# Patient Record
Sex: Male | Born: 1995 | Race: White | Hispanic: No | Marital: Single | Smoking: Current some day smoker
Health system: Southern US, Community
[De-identification: ages and names within clinical notes are randomized; demographics above are authoritative.]

## PROBLEM LIST (undated history)

## (undated) DIAGNOSIS — W3400XA Accidental discharge from unspecified firearms or gun, initial encounter: Secondary | ICD-10-CM

## (undated) DIAGNOSIS — Y249XXA Unspecified firearm discharge, undetermined intent, initial encounter: Secondary | ICD-10-CM

## (undated) HISTORY — DX: Unspecified firearm discharge, undetermined intent, initial encounter: Y24.9XXA

## (undated) HISTORY — DX: Accidental discharge from unspecified firearms or gun, initial encounter: W34.00XA

---
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
## 2015-08-06 ENCOUNTER — Emergency Department (HOSPITAL_COMMUNITY)
Admission: EM | Admit: 2015-08-06 | Discharge: 2015-08-06 | Discharge status: Against Medical Advice | Payer: Self-pay | Attending: Emergency Medicine | Admitting: Emergency Medicine

## 2015-08-06 ENCOUNTER — Encounter (HOSPITAL_COMMUNITY): Payer: Self-pay | Admitting: *Deleted

## 2015-08-06 DIAGNOSIS — Z720 Tobacco use: Secondary | ICD-10-CM | POA: Insufficient documentation

## 2015-08-06 DIAGNOSIS — K088 Other specified disorders of teeth and supporting structures: Secondary | ICD-10-CM | POA: Insufficient documentation

## 2015-08-06 NOTE — ED Notes (Signed)
Called patient x2 sub waiting and main waiting no answer

## 2015-08-06 NOTE — ED Notes (Signed)
Pt has left side dental pain with mild facial swelling. Airway intact.

## 2017-02-24 ENCOUNTER — Emergency Department (HOSPITAL_COMMUNITY)
Admission: EM | Admit: 2017-02-24 | Discharge: 2017-02-24 | Disposition: A | Discharge status: Home / Self Care | Payer: Self-pay | Attending: Emergency Medicine | Admitting: Emergency Medicine

## 2017-02-24 ENCOUNTER — Encounter (HOSPITAL_COMMUNITY): Payer: Self-pay

## 2017-02-24 DIAGNOSIS — K029 Dental caries, unspecified: Secondary | ICD-10-CM

## 2017-02-24 DIAGNOSIS — F17210 Nicotine dependence, cigarettes, uncomplicated: Secondary | ICD-10-CM | POA: Insufficient documentation

## 2017-02-24 DIAGNOSIS — K047 Periapical abscess without sinus: Secondary | ICD-10-CM | POA: Insufficient documentation

## 2017-02-24 MED ORDER — PENICILLIN V POTASSIUM 500 MG PO TABS: 500.0000 mg | ORAL_TABLET | Freq: Three times a day (TID) | ORAL | 0 refills | Status: AC

## 2017-02-24 MED ORDER — HYDROCODONE-ACETAMINOPHEN 5-325 MG PO TABS
1.0000 | ORAL_TABLET | Freq: Once | ORAL | Status: AC
  Administered 2017-02-24: 1 via ORAL
  Filled 2017-02-24: qty 1

## 2017-02-24 MED ORDER — NAPROXEN 500 MG PO TABS: 500.0000 mg | ORAL_TABLET | Freq: Two times a day (BID) | ORAL | 0 refills | Status: AC | PRN

## 2017-02-24 NOTE — ED Provider Notes (Signed)
[facility]-EMERGENCY DEPT Provider Note   CSN: [id number] Arrival date & time: 02/24/17  1733  By signing my name below, I, Modena Jansky, attest that this documentation has been prepared under the direction and in the presence of non-physician practitioner, [place] Russo, PA-C. Electronically Signed: Modena Jansky, Scribe. 02/24/2017. 7:25 PM.  History   Chief Complaint Chief Complaint  Patient presents with  . Dental Pain   The history is provided by the patient. No language interpreter was used.    HPI Comments: Robert Potter is a 21 y.o. male who presents to the Emergency Department complaining of constant, moderate, throbbing, right lower dental pain that started last night. He has been taking ibuprofen and Tylenol without any relief. He has a tooth that has been causing him pain intermittently over the past 2 weeks. He has no dentist. He reports associated and anterior neck pain.  Denies fever, difficulty swallowing, difficulty breathing, or other complaints at this time.  History reviewed. No pertinent past medical history.  There are no active problems to display for this patient.   History reviewed. No pertinent surgical history.     Home Medications    Prior to Admission medications   Medication Sig Start Date End Date Taking? Authorizing Provider  naproxen (NAPROSYN) 500 MG tablet Take 1 tablet (500 mg total) by mouth 2 (two) times daily as needed for moderate pain. 02/24/17   [place] N Russo, PA-C  penicillin v potassium (VEETID) 500 MG tablet Take 1 tablet (500 mg total) by mouth 3 (three) times daily. 02/24/17 03/03/17  [place] N Russo, PA-C    Family History History reviewed. No pertinent family history.  Social History Social History  Substance Use Topics  . Smoking status: Current Every Day Smoker    Packs/day: 1.00    Types: Cigarettes  . Smokeless tobacco: Never Used  . Alcohol use No     Allergies   Patient has no known allergies.   Review of  Systems Review of Systems  Constitutional: Negative for fever.  HENT: Positive for dental problem (Right lower) and sore throat. Negative for trouble swallowing.   Musculoskeletal: Positive for neck pain (Anterior).     Physical Exam Updated Vital Signs BP (!) 144/99 (BP Location: Left Arm)   Pulse 64   Temp 98.4 F (36.9 C) (Oral)   Resp 16   Ht 5' 8.5" (1.74 m)   Wt 150 lb (68 kg)   SpO2 98%   BMI 22.48 kg/m   Physical Exam  Constitutional: He appears well-developed and well-nourished.  HENT:  Head: Normocephalic and atraumatic.  Mouth/Throat: Uvula is midline, oropharynx is clear and moist and mucous membranes are normal. No trismus in the jaw. Dental caries present. No dental abscesses or uvula swelling.  Right lower 2nd to last molar w obvious dental caries. Gingiva surrounding tooth mildly erythematous and edematous without fluctuant abscess. No drainaged noted. Poor dentition.  Eyes: Conjunctivae are normal.  Neck: Normal range of motion. Neck supple.  Pulmonary/Chest: Effort normal.  Lymphadenopathy:    He has no cervical adenopathy.  Psychiatric: He has a normal mood and affect. His behavior is normal.  Nursing note and vitals reviewed.    ED Treatments / Results  DIAGNOSTIC STUDIES: Oxygen Saturation is 98% on RA, normal by my interpretation.    COORDINATION OF CARE: 7:29 PM- Pt advised of plan for treatment and pt agrees.  Labs (all labs ordered are listed, but only abnormal results are displayed) Labs Reviewed - No data to display  EKG  EKG Interpretation None       Radiology No results found.  Procedures Procedures (including critical care time)  Medications Ordered in ED Medications  HYDROcodone-acetaminophen (NORCO/VICODIN) 5-325 MG per tablet 1 tablet (1 tablet Oral Given 02/24/17 2003)     Initial Impression / Assessment and Plan / ED Course  I have reviewed the triage vital signs and the nursing notes.  Pertinent labs & imaging  results that were available during my care of the patient were reviewed by me and considered in my medical decision making (see chart for details).      Patient with dental pain.  No gross abscess.  Exam unconcerning for Ludwig's angina or spread of infection.  Will treat with penicillin and pain medicine.  Urged patient to follow-up with dentist.  Resource packet given in d/c paperwork. Strict return precautions given. Pt afebrile, safe for discharge.  Patient discussed with Dr. Lynelle Doctor. Discussed results, findings, treatment and follow up. Patient advised of return precautions. Patient verbalized understanding and agreed with plan.   Final Clinical Impressions(s) / ED Diagnoses   Final diagnoses:  Pain due to dental caries  Dental infection    New Prescriptions Discharge Medication List as of 02/24/2017  7:55 PM    START taking these medications   Details  naproxen (NAPROSYN) 500 MG tablet Take 1 tablet (500 mg total) by mouth 2 (two) times daily as needed for moderate pain., Starting Thu 02/24/2017, Print    penicillin v potassium (VEETID) 500 MG tablet Take 1 tablet (500 mg total) by mouth 3 (three) times daily., Starting Thu 02/24/2017, Until Thu 03/03/2017, Print      I personally performed the services described in this documentation, which was scribed in my presence. The recorded information has been reviewed and is accurate.    [place] N Russo, PA-C 02/25/17 1610    Linwood Dibbles, MD 02/28/17 1330

## 2017-02-24 NOTE — ED Triage Notes (Signed)
Pt complains of right lower tooth pain x 2 weeks, pt appears to have broken tooth. Afebrile. Pt does not have a dentist.

## 2017-02-24 NOTE — Discharge Instructions (Signed)
Please read instructions below. You can take the prescribed Naproxen for pain. Take the antibiotic (penicillin) as prescribed until they are finished. Schedule an appointment with a dentist for management of your dental infection. Return to the ER for inability to swallow liquids, difficulty breathing, new or worsening symptoms.

## 2020-09-26 ENCOUNTER — Emergency Department (HOSPITAL_COMMUNITY): Payer: Self-pay

## 2020-09-26 ENCOUNTER — Encounter (HOSPITAL_COMMUNITY)
Admission: EM | Disposition: A | Discharge status: Home Health | Payer: Self-pay | Source: Home / Self Care | Attending: Vascular Surgery

## 2020-09-26 ENCOUNTER — Emergency Department (HOSPITAL_COMMUNITY): Payer: Self-pay | Admitting: Anesthesiology

## 2020-09-26 ENCOUNTER — Inpatient Hospital Stay (HOSPITAL_COMMUNITY)
Admission: EM | Admit: 2020-09-26 | Discharge: 2020-09-28 | DRG: 909 | Disposition: A | Discharge status: Home Health | Payer: Self-pay | Attending: Vascular Surgery | Admitting: Vascular Surgery

## 2020-09-26 ENCOUNTER — Encounter (HOSPITAL_COMMUNITY): Payer: Self-pay | Admitting: *Deleted

## 2020-09-26 ENCOUNTER — Other Ambulatory Visit: Payer: Self-pay

## 2020-09-26 DIAGNOSIS — F10920 Alcohol use, unspecified with intoxication, uncomplicated: Principal | ICD-10-CM

## 2020-09-26 DIAGNOSIS — W3400XA Accidental discharge from unspecified firearms or gun, initial encounter: Secondary | ICD-10-CM

## 2020-09-26 DIAGNOSIS — F17210 Nicotine dependence, cigarettes, uncomplicated: Secondary | ICD-10-CM | POA: Diagnosis present

## 2020-09-26 DIAGNOSIS — Z23 Encounter for immunization: Secondary | ICD-10-CM

## 2020-09-26 DIAGNOSIS — S75021A Major laceration of femoral artery, right leg, initial encounter: Principal | ICD-10-CM | POA: Diagnosis present

## 2020-09-26 DIAGNOSIS — Z20822 Contact with and (suspected) exposure to covid-19: Secondary | ICD-10-CM | POA: Diagnosis present

## 2020-09-26 DIAGNOSIS — F10120 Alcohol abuse with intoxication, uncomplicated: Secondary | ICD-10-CM | POA: Diagnosis present

## 2020-09-26 DIAGNOSIS — S71131A Puncture wound without foreign body, right thigh, initial encounter: Secondary | ICD-10-CM | POA: Diagnosis present

## 2020-09-26 HISTORY — PX: FEMORAL ARTERY EXPLORATION: SHX5160

## 2020-09-26 HISTORY — PX: PATCH ANGIOPLASTY: SHX6230

## 2020-09-26 LAB — I-STAT CHEM 8, ED
BUN: 4 mg/dL — ABNORMAL LOW (ref 6–20)
Calcium, Ion: 1.02 mmol/L — ABNORMAL LOW (ref 1.15–1.40)
Chloride: 101 mmol/L (ref 98–111)
Creatinine, Ser: 1.3 mg/dL — ABNORMAL HIGH (ref 0.61–1.24)
Glucose, Bld: 121 mg/dL — ABNORMAL HIGH (ref 70–99)
HCT: 56 % — ABNORMAL HIGH (ref 39.0–52.0)
Hemoglobin: 19 g/dL — ABNORMAL HIGH (ref 13.0–17.0)
Potassium: 4.2 mmol/L (ref 3.5–5.1)
Sodium: 140 mmol/L (ref 135–145)
TCO2: 22 mmol/L (ref 22–32)

## 2020-09-26 LAB — COMPREHENSIVE METABOLIC PANEL
ALT: 39 U/L (ref 0–44)
AST: 27 U/L (ref 15–41)
Albumin: 4.2 g/dL (ref 3.5–5.0)
Alkaline Phosphatase: 62 U/L (ref 38–126)
Anion gap: 12 (ref 5–15)
BUN: 5 mg/dL — ABNORMAL LOW (ref 6–20)
CO2: 25 mmol/L (ref 22–32)
Calcium: 8.9 mg/dL (ref 8.9–10.3)
Chloride: 103 mmol/L (ref 98–111)
Creatinine, Ser: 0.98 mg/dL (ref 0.61–1.24)
GFR, Estimated: >60 mL/min (ref >60)
Glucose, Bld: 129 mg/dL — ABNORMAL HIGH (ref 70–99)
Potassium: 4.2 mmol/L (ref 3.5–5.1)
Sodium: 140 mmol/L (ref 135–145)
Total Bilirubin: 0.3 mg/dL (ref 0.3–1.2)
Total Protein: 6.7 g/dL (ref 6.5–8.1)

## 2020-09-26 LAB — RESPIRATORY PANEL BY RT PCR (FLU A&B, COVID)
Influenza A by PCR: NEGATIVE
Influenza B by PCR: NEGATIVE
SARS Coronavirus 2 by RT PCR: NEGATIVE

## 2020-09-26 LAB — ETHANOL: Alcohol, Ethyl (B): 209 mg/dL — ABNORMAL HIGH (ref <10)

## 2020-09-26 LAB — SAMPLE TO BLOOD BANK

## 2020-09-26 LAB — CBC
HCT: 55.2 % — ABNORMAL HIGH (ref 39.0–52.0)
Hemoglobin: 18.9 g/dL — ABNORMAL HIGH (ref 13.0–17.0)
MCH: 32 pg (ref 26.0–34.0)
MCHC: 34.2 g/dL (ref 30.0–36.0)
MCV: 93.6 fL (ref 80.0–100.0)
Platelets: 302 10*3/uL (ref 150–400)
RBC: 5.9 MIL/uL — ABNORMAL HIGH (ref 4.22–5.81)
RDW: 12.2 % (ref 11.5–15.5)
WBC: 11.2 10*3/uL — ABNORMAL HIGH (ref 4.0–10.5)
nRBC: 0 % (ref 0.0–0.2)

## 2020-09-26 LAB — PROTIME-INR
INR: 1 (ref 0.8–1.2)
INR: 1 (ref 0.8–1.2)
Prothrombin Time: 12.5 seconds (ref 11.4–15.2)
Prothrombin Time: 12.7 seconds (ref 11.4–15.2)

## 2020-09-26 SURGERY — EXPLORATION, ARTERY, FEMORAL
Anesthesia: General | Site: Leg Upper | Laterality: Right

## 2020-09-26 MED ORDER — HYDRALAZINE HCL 20 MG/ML IJ SOLN: 5.0000 mg | INTRAMUSCULAR | Status: DC | PRN

## 2020-09-26 MED ORDER — LACTATED RINGERS IV SOLN: INTRAVENOUS | Status: DC

## 2020-09-26 MED ORDER — POTASSIUM CHLORIDE CRYS ER 20 MEQ PO TBCR: 20.0000 meq | EXTENDED_RELEASE_TABLET | Freq: Every day | ORAL | Status: DC | PRN

## 2020-09-26 MED ORDER — DEXAMETHASONE SODIUM PHOSPHATE 10 MG/ML IJ SOLN
INTRAMUSCULAR | Status: AC
  Filled 2020-09-26: qty 1

## 2020-09-26 MED ORDER — ACETAMINOPHEN 10 MG/ML IV SOLN
INTRAVENOUS | Status: AC
  Filled 2020-09-26: qty 100

## 2020-09-26 MED ORDER — SODIUM CHLORIDE 0.9 % IV SOLN: INTRAVENOUS | Status: DC

## 2020-09-26 MED ORDER — PROPOFOL 10 MG/ML IV BOLUS
INTRAVENOUS | Status: AC
  Filled 2020-09-26: qty 20

## 2020-09-26 MED ORDER — SODIUM CHLORIDE 0.9 % IV SOLN
INTRAVENOUS | Status: DC | PRN
  Administered 2020-09-26: 500 mL

## 2020-09-26 MED ORDER — MAGNESIUM SULFATE 2 GM/50ML IV SOLN: 2.0000 g | Freq: Every day | INTRAVENOUS | Status: DC | PRN

## 2020-09-26 MED ORDER — SODIUM CHLORIDE 0.9 % IV SOLN: 500.0000 mL | Freq: Once | INTRAVENOUS | Status: DC | PRN

## 2020-09-26 MED ORDER — ACETAMINOPHEN 10 MG/ML IV SOLN
1000.0000 mg | Freq: Once | INTRAVENOUS | Status: DC | PRN
  Administered 2020-09-26: 1000 mg via INTRAVENOUS

## 2020-09-26 MED ORDER — TETANUS-DIPHTH-ACELL PERTUSSIS 5-2.5-18.5 LF-MCG/0.5 IM SUSY
0.5000 mL | PREFILLED_SYRINGE | Freq: Once | INTRAMUSCULAR | Status: AC
  Administered 2020-09-26: 0.5 mL via INTRAMUSCULAR
  Filled 2020-09-26: qty 0.5

## 2020-09-26 MED ORDER — FENTANYL CITRATE (PF) 250 MCG/5ML IJ SOLN
INTRAMUSCULAR | Status: AC
  Filled 2020-09-26: qty 5

## 2020-09-26 MED ORDER — ONDANSETRON HCL 4 MG/2ML IJ SOLN
INTRAMUSCULAR | Status: DC | PRN
  Administered 2020-09-26: 4 mg via INTRAVENOUS

## 2020-09-26 MED ORDER — HEPARIN SODIUM (PORCINE) 1000 UNIT/ML IJ SOLN
INTRAMUSCULAR | Status: AC
  Filled 2020-09-26: qty 1

## 2020-09-26 MED ORDER — PROPOFOL 10 MG/ML IV BOLUS
INTRAVENOUS | Status: DC | PRN
  Administered 2020-09-26: 160 mg via INTRAVENOUS

## 2020-09-26 MED ORDER — HYDROMORPHONE HCL 1 MG/ML IJ SOLN
INTRAMUSCULAR | Status: AC
  Administered 2020-09-26: 0.25 mg via INTRAVENOUS
  Filled 2020-09-26: qty 1

## 2020-09-26 MED ORDER — HYDROMORPHONE HCL 1 MG/ML IJ SOLN
0.2500 mg | INTRAMUSCULAR | Status: DC | PRN
  Administered 2020-09-26: 0.25 mg via INTRAVENOUS
  Administered 2020-09-26 (×2): 0.5 mg via INTRAVENOUS

## 2020-09-26 MED ORDER — MIDAZOLAM HCL 5 MG/5ML IJ SOLN
INTRAMUSCULAR | Status: DC | PRN
  Administered 2020-09-26: 2 mg via INTRAVENOUS

## 2020-09-26 MED ORDER — HEPARIN SODIUM (PORCINE) 1000 UNIT/ML IJ SOLN
INTRAMUSCULAR | Status: DC | PRN
  Administered 2020-09-26: 8500 [IU] via INTRAVENOUS

## 2020-09-26 MED ORDER — THROMBIN (RECOMBINANT) 20000 UNITS EX SOLR
CUTANEOUS | Status: AC
  Filled 2020-09-26: qty 20000

## 2020-09-26 MED ORDER — DEXMEDETOMIDINE (PRECEDEX) IN NS 20 MCG/5ML (4 MCG/ML) IV SYRINGE
PREFILLED_SYRINGE | INTRAVENOUS | Status: DC | PRN
  Administered 2020-09-26: 8 ug via INTRAVENOUS
  Administered 2020-09-26 (×2): 12 ug via INTRAVENOUS
  Administered 2020-09-26: 8 ug via INTRAVENOUS

## 2020-09-26 MED ORDER — ALUM & MAG HYDROXIDE-SIMETH 200-200-20 MG/5ML PO SUSP: 15.0000 mL | ORAL | Status: DC | PRN

## 2020-09-26 MED ORDER — SUGAMMADEX SODIUM 200 MG/2ML IV SOLN
INTRAVENOUS | Status: DC | PRN
  Administered 2020-09-26: 200 mg via INTRAVENOUS

## 2020-09-26 MED ORDER — MORPHINE SULFATE (PF) 2 MG/ML IV SOLN
2.0000 mg | INTRAVENOUS | Status: DC | PRN
  Administered 2020-09-26 – 2020-09-27 (×4): 2 mg via INTRAVENOUS
  Filled 2020-09-26 (×4): qty 1

## 2020-09-26 MED ORDER — SODIUM CHLORIDE 0.9 % IV BOLUS (SEPSIS)
1000.0000 mL | Freq: Once | INTRAVENOUS | Status: AC
  Administered 2020-09-26: 1000 mL via INTRAVENOUS

## 2020-09-26 MED ORDER — BISACODYL 10 MG RE SUPP: 10.0000 mg | Freq: Every day | RECTAL | Status: DC | PRN

## 2020-09-26 MED ORDER — HEPARIN (PORCINE) 25000 UT/250ML-% IV SOLN
600.0000 [IU]/h | INTRAVENOUS | Status: DC
  Administered 2020-09-26: 600 [IU]/h via INTRAVENOUS
  Filled 2020-09-26: qty 250

## 2020-09-26 MED ORDER — MORPHINE SULFATE (PF) 4 MG/ML IV SOLN
4.0000 mg | Freq: Once | INTRAVENOUS | Status: AC
  Administered 2020-09-26: 4 mg via INTRAVENOUS
  Filled 2020-09-26: qty 1

## 2020-09-26 MED ORDER — ACETAMINOPHEN 160 MG/5ML PO SOLN: 325.0000 mg | Freq: Once | ORAL | Status: DC | PRN

## 2020-09-26 MED ORDER — ORAL CARE MOUTH RINSE: 15.0000 mL | Freq: Once | OROMUCOSAL | Status: AC

## 2020-09-26 MED ORDER — PAPAVERINE HCL 30 MG/ML IJ SOLN
INTRAMUSCULAR | Status: DC | PRN
  Administered 2020-09-26: 60 mg via INTRAVENOUS

## 2020-09-26 MED ORDER — FENTANYL CITRATE (PF) 100 MCG/2ML IJ SOLN
INTRAMUSCULAR | Status: DC | PRN
  Administered 2020-09-26 (×3): 50 ug via INTRAVENOUS
  Administered 2020-09-26: 100 ug via INTRAVENOUS
  Administered 2020-09-26 (×2): 50 ug via INTRAVENOUS

## 2020-09-26 MED ORDER — CEFAZOLIN SODIUM-DEXTROSE 2-4 GM/100ML-% IV SOLN: 2.0000 g | INTRAVENOUS | Status: DC

## 2020-09-26 MED ORDER — IOHEXOL 350 MG/ML SOLN
100.0000 mL | Freq: Once | INTRAVENOUS | Status: AC | PRN
  Administered 2020-09-26: 100 mL via INTRAVENOUS

## 2020-09-26 MED ORDER — DEXMEDETOMIDINE (PRECEDEX) IN NS 20 MCG/5ML (4 MCG/ML) IV SYRINGE
PREFILLED_SYRINGE | INTRAVENOUS | Status: AC
  Filled 2020-09-26: qty 10

## 2020-09-26 MED ORDER — PAPAVERINE HCL 30 MG/ML IJ SOLN
INTRAMUSCULAR | Status: AC
  Filled 2020-09-26: qty 2

## 2020-09-26 MED ORDER — METOPROLOL TARTRATE 5 MG/5ML IV SOLN: 2.0000 mg | INTRAVENOUS | Status: DC | PRN

## 2020-09-26 MED ORDER — ASPIRIN EC 81 MG PO TBEC
81.0000 mg | DELAYED_RELEASE_TABLET | Freq: Every day | ORAL | Status: DC
  Administered 2020-09-27 – 2020-09-28 (×2): 81 mg via ORAL
  Filled 2020-09-26 (×2): qty 1

## 2020-09-26 MED ORDER — CHLORHEXIDINE GLUCONATE 0.12 % MT SOLN
15.0000 mL | Freq: Once | OROMUCOSAL | Status: AC
  Administered 2020-09-26: 15 mL via OROMUCOSAL

## 2020-09-26 MED ORDER — HYDROMORPHONE HCL 1 MG/ML IJ SOLN
INTRAMUSCULAR | Status: AC
  Administered 2020-09-26: 0.5 mg via INTRAVENOUS
  Filled 2020-09-26: qty 1

## 2020-09-26 MED ORDER — GUAIFENESIN-DM 100-10 MG/5ML PO SYRP: 15.0000 mL | ORAL_SOLUTION | ORAL | Status: DC | PRN

## 2020-09-26 MED ORDER — ACETAMINOPHEN 325 MG PO TABS: 325.0000 mg | ORAL_TABLET | ORAL | Status: DC | PRN

## 2020-09-26 MED ORDER — 0.9 % SODIUM CHLORIDE (POUR BTL) OPTIME
TOPICAL | Status: DC | PRN
  Administered 2020-09-26 (×2): 1000 mL

## 2020-09-26 MED ORDER — ALBUTEROL SULFATE HFA 108 (90 BASE) MCG/ACT IN AERS
INHALATION_SPRAY | RESPIRATORY_TRACT | Status: DC | PRN
  Administered 2020-09-26 (×2): 3 via RESPIRATORY_TRACT

## 2020-09-26 MED ORDER — CEFAZOLIN SODIUM-DEXTROSE 2-3 GM-%(50ML) IV SOLR
INTRAVENOUS | Status: DC | PRN
  Administered 2020-09-26: 2 g via INTRAVENOUS

## 2020-09-26 MED ORDER — LIDOCAINE 2% (20 MG/ML) 5 ML SYRINGE
INTRAMUSCULAR | Status: DC | PRN
  Administered 2020-09-26: 40 mg via INTRAVENOUS

## 2020-09-26 MED ORDER — ONDANSETRON HCL 4 MG/2ML IJ SOLN
INTRAMUSCULAR | Status: AC
  Filled 2020-09-26: qty 2

## 2020-09-26 MED ORDER — CEFAZOLIN SODIUM-DEXTROSE 2-4 GM/100ML-% IV SOLN
2.0000 g | Freq: Once | INTRAVENOUS | Status: AC
  Administered 2020-09-26: 2 g via INTRAVENOUS
  Filled 2020-09-26: qty 100

## 2020-09-26 MED ORDER — ACETAMINOPHEN 325 MG PO TABS: 325.0000 mg | ORAL_TABLET | Freq: Once | ORAL | Status: DC | PRN

## 2020-09-26 MED ORDER — AMISULPRIDE (ANTIEMETIC) 5 MG/2ML IV SOLN: 10.0000 mg | Freq: Once | INTRAVENOUS | Status: DC | PRN

## 2020-09-26 MED ORDER — FENTANYL CITRATE (PF) 100 MCG/2ML IJ SOLN
50.0000 ug | Freq: Once | INTRAMUSCULAR | Status: AC
  Administered 2020-09-26: 50 ug via INTRAVENOUS
  Filled 2020-09-26: qty 2

## 2020-09-26 MED ORDER — ONDANSETRON HCL 4 MG/2ML IJ SOLN: 4.0000 mg | Freq: Four times a day (QID) | INTRAMUSCULAR | Status: DC | PRN

## 2020-09-26 MED ORDER — SODIUM CHLORIDE 0.9 % IV SOLN
INTRAVENOUS | Status: AC
  Filled 2020-09-26: qty 1.2

## 2020-09-26 MED ORDER — ACETAMINOPHEN 650 MG RE SUPP: 325.0000 mg | RECTAL | Status: DC | PRN

## 2020-09-26 MED ORDER — PHENOL 1.4 % MT LIQD: 1.0000 | OROMUCOSAL | Status: DC | PRN

## 2020-09-26 MED ORDER — ROCURONIUM BROMIDE 10 MG/ML (PF) SYRINGE
PREFILLED_SYRINGE | INTRAVENOUS | Status: DC | PRN
  Administered 2020-09-26: 60 mg via INTRAVENOUS
  Administered 2020-09-26: 20 mg via INTRAVENOUS

## 2020-09-26 MED ORDER — DOCUSATE SODIUM 100 MG PO CAPS
100.0000 mg | ORAL_CAPSULE | Freq: Every day | ORAL | Status: DC
  Filled 2020-09-26 (×2): qty 1

## 2020-09-26 MED ORDER — POLYETHYLENE GLYCOL 3350 17 G PO PACK: 17.0000 g | PACK | Freq: Every day | ORAL | Status: DC | PRN

## 2020-09-26 MED ORDER — PANTOPRAZOLE SODIUM 40 MG PO TBEC
40.0000 mg | DELAYED_RELEASE_TABLET | Freq: Every day | ORAL | Status: DC
  Administered 2020-09-27 – 2020-09-28 (×2): 40 mg via ORAL
  Filled 2020-09-26 (×2): qty 1

## 2020-09-26 MED ORDER — MEPERIDINE HCL 25 MG/ML IJ SOLN: 6.2500 mg | INTRAMUSCULAR | Status: DC | PRN

## 2020-09-26 MED ORDER — CEFAZOLIN SODIUM-DEXTROSE 2-4 GM/100ML-% IV SOLN
2.0000 g | Freq: Three times a day (TID) | INTRAVENOUS | Status: AC
  Administered 2020-09-26 – 2020-09-27 (×2): 2 g via INTRAVENOUS
  Filled 2020-09-26 (×2): qty 100

## 2020-09-26 MED ORDER — SUCCINYLCHOLINE CHLORIDE 200 MG/10ML IV SOSY
PREFILLED_SYRINGE | INTRAVENOUS | Status: DC | PRN
  Administered 2020-09-26: 120 mg via INTRAVENOUS

## 2020-09-26 MED ORDER — OXYCODONE-ACETAMINOPHEN 5-325 MG PO TABS
1.0000 | ORAL_TABLET | ORAL | Status: DC | PRN
  Administered 2020-09-26 – 2020-09-28 (×9): 2 via ORAL
  Filled 2020-09-26 (×9): qty 2

## 2020-09-26 MED ORDER — DEXAMETHASONE SODIUM PHOSPHATE 10 MG/ML IJ SOLN
INTRAMUSCULAR | Status: DC | PRN
  Administered 2020-09-26: 10 mg via INTRAVENOUS

## 2020-09-26 MED ORDER — CHLORHEXIDINE GLUCONATE 0.12 % MT SOLN
OROMUCOSAL | Status: AC
  Filled 2020-09-26: qty 15

## 2020-09-26 MED ORDER — MIDAZOLAM HCL 2 MG/2ML IJ SOLN
INTRAMUSCULAR | Status: AC
  Filled 2020-09-26: qty 2

## 2020-09-26 MED ORDER — LABETALOL HCL 5 MG/ML IV SOLN
10.0000 mg | INTRAVENOUS | Status: DC | PRN
  Administered 2020-09-27 (×2): 10 mg via INTRAVENOUS
  Filled 2020-09-26 (×2): qty 4

## 2020-09-26 SURGICAL SUPPLY — 62 items
BANDAGE ESMARK 6X9 LF (GAUZE/BANDAGES/DRESSINGS) IMPLANT
BNDG ELASTIC 4X5.8 VLCR STR LF (GAUZE/BANDAGES/DRESSINGS) IMPLANT
BNDG ESMARK 6X9 LF (GAUZE/BANDAGES/DRESSINGS)
CANISTER SUCT 3000ML PPV (MISCELLANEOUS) ×3 IMPLANT
CANNULA VESSEL 3MM 2 BLNT TIP (CANNULA) IMPLANT
CLIP VESOCCLUDE MED 24/CT (CLIP) ×3 IMPLANT
CLIP VESOCCLUDE SM WIDE 24/CT (CLIP) ×3 IMPLANT
COVER PROBE W GEL 5X96 (DRAPES) ×3 IMPLANT
COVER WAND RF STERILE (DRAPES) ×3 IMPLANT
CUFF TOURN SGL QUICK 24 (TOURNIQUET CUFF)
CUFF TOURN SGL QUICK 34 (TOURNIQUET CUFF)
CUFF TOURN SGL QUICK 42 (TOURNIQUET CUFF) IMPLANT
CUFF TRNQT CYL 24X4X16.5-23 (TOURNIQUET CUFF) IMPLANT
CUFF TRNQT CYL 34X4.125X (TOURNIQUET CUFF) IMPLANT
DERMABOND ADVANCED (GAUZE/BANDAGES/DRESSINGS) ×2
DERMABOND ADVANCED .7 DNX12 (GAUZE/BANDAGES/DRESSINGS) ×1 IMPLANT
DRAIN CHANNEL 15F RND FF W/TCR (WOUND CARE) IMPLANT
DRAIN HEMOVAC 1/8 X 5 (WOUND CARE) ×3 IMPLANT
DRAIN PENROSE 3/4X12 (DRAIN) IMPLANT
DRAPE X-RAY CASS 24X20 (DRAPES) IMPLANT
DRSG COVADERM 4X8 (GAUZE/BANDAGES/DRESSINGS) ×3 IMPLANT
DRSG TEGADERM 4X4.75 (GAUZE/BANDAGES/DRESSINGS) ×6 IMPLANT
ELECT REM PT RETURN 9FT ADLT (ELECTROSURGICAL) ×3
ELECTRODE REM PT RTRN 9FT ADLT (ELECTROSURGICAL) ×1 IMPLANT
EVACUATOR SILICONE 100CC (DRAIN) ×3 IMPLANT
GAUZE SPONGE 2X2 8PLY STRL LF (GAUZE/BANDAGES/DRESSINGS) ×1 IMPLANT
GAUZE SPONGE 4X4 16PLY XRAY LF (GAUZE/BANDAGES/DRESSINGS) ×3 IMPLANT
GLOVE BIO SURGEON STRL SZ7.5 (GLOVE) ×3 IMPLANT
GLOVE BIOGEL PI IND STRL 6.5 (GLOVE) ×1 IMPLANT
GLOVE BIOGEL PI IND STRL 8 (GLOVE) ×1 IMPLANT
GLOVE BIOGEL PI INDICATOR 6.5 (GLOVE) ×2
GLOVE BIOGEL PI INDICATOR 8 (GLOVE) ×2
GOWN STRL REUS W/ TWL LRG LVL3 (GOWN DISPOSABLE) ×3 IMPLANT
GOWN STRL REUS W/TWL LRG LVL3 (GOWN DISPOSABLE) ×9
KIT BASIN OR (CUSTOM PROCEDURE TRAY) ×3 IMPLANT
KIT TURNOVER KIT B (KITS) ×3 IMPLANT
MARKER GRAFT CORONARY BYPASS (MISCELLANEOUS) IMPLANT
NS IRRIG 1000ML POUR BTL (IV SOLUTION) ×6 IMPLANT
PACK PERIPHERAL VASCULAR (CUSTOM PROCEDURE TRAY) ×3 IMPLANT
PAD ARMBOARD 7.5X6 YLW CONV (MISCELLANEOUS) ×6 IMPLANT
SET COLLECT BLD 21X3/4 12 (NEEDLE) IMPLANT
SPONGE GAUZE 2X2 STER 10/PKG (GAUZE/BANDAGES/DRESSINGS) ×2
SPONGE SURGIFOAM ABS GEL 100 (HEMOSTASIS) IMPLANT
STAPLER VISISTAT (STAPLE) IMPLANT
STOPCOCK 4 WAY LG BORE MALE ST (IV SETS) IMPLANT
SUT ETHILON 3 0 PS 1 (SUTURE) ×3 IMPLANT
SUT PROLENE 5 0 C 1 24 (SUTURE) ×3 IMPLANT
SUT PROLENE 6 0 BV (SUTURE) ×6 IMPLANT
SUT PROLENE 7 0 BV 1 (SUTURE) IMPLANT
SUT SILK 2 0 PERMA HAND 18 BK (SUTURE) ×3 IMPLANT
SUT SILK 3 0 (SUTURE)
SUT SILK 3-0 18XBRD TIE 12 (SUTURE) IMPLANT
SUT VIC AB 2-0 CTB1 (SUTURE) ×3 IMPLANT
SUT VIC AB 3-0 SH 27 (SUTURE) ×6
SUT VIC AB 3-0 SH 27X BRD (SUTURE) ×2 IMPLANT
SUT VICRYL 4-0 PS2 18IN ABS (SUTURE) ×6 IMPLANT
TAPE CLOTH SURG 4X10 WHT LF (GAUZE/BANDAGES/DRESSINGS) ×3 IMPLANT
TOWEL GREEN STERILE (TOWEL DISPOSABLE) ×3 IMPLANT
TRAY FOLEY MTR SLVR 16FR STAT (SET/KITS/TRAYS/PACK) ×3 IMPLANT
TUBING EXTENTION W/L.L. (IV SETS) IMPLANT
UNDERPAD 30X36 HEAVY ABSORB (UNDERPADS AND DIAPERS) ×3 IMPLANT
WATER STERILE IRR 1000ML POUR (IV SOLUTION) ×3 IMPLANT

## 2020-09-26 NOTE — Anesthesia Procedure Notes (Signed)
Procedure Name: Intubation Date/Time: 09/26/2020 12:55 PM Performed by: Adria Dill, CRNA Pre-anesthesia Checklist: Patient identified, Emergency Drugs available, Suction available and Patient being monitored Patient Re-evaluated:Patient Re-evaluated prior to induction Oxygen Delivery Method: Circle system utilized Preoxygenation: Pre-oxygenation with 100% oxygen Induction Type: IV induction, Rapid sequence and Cricoid Pressure applied Laryngoscope Size: Miller and 2 Grade View: Grade I Tube type: Oral Tube size: 7.5 mm Number of attempts: 1 Airway Equipment and Method: Stylet and Oral airway Placement Confirmation: ETT inserted through vocal cords under direct vision,  positive ETCO2 and breath sounds checked- equal and bilateral Secured at: 21 cm Tube secured with: Tape Dental Injury: Teeth and Oropharynx as per pre-operative assessment

## 2020-09-26 NOTE — Anesthesia Preprocedure Evaluation (Addendum)
Anesthesia Evaluation  Patient identified by MRN, date of birth, ID band Patient awake    Reviewed: Allergy & Precautions, NPO status , Patient's Chart, lab work & pertinent test results  Airway Mallampati: II  TM Distance: >3 FB Neck ROM: Full    Dental  (+) Teeth Intact, Dental Advisory Given   Pulmonary Current Smoker and Patient abstained from smoking.,    breath sounds clear to auscultation       Cardiovascular negative cardio ROS   Rhythm:Regular Rate:Normal     Neuro/Psych negative neurological ROS  negative psych ROS   GI/Hepatic negative GI ROS, Neg liver ROS,   Endo/Other  negative endocrine ROS  Renal/GU negative Renal ROS     Musculoskeletal negative musculoskeletal ROS (+)   Abdominal Normal abdominal exam  (+)   Peds  Hematology negative hematology ROS (+)   Anesthesia Other Findings   Reproductive/Obstetrics                             Anesthesia Physical Anesthesia Plan  ASA: II  Anesthesia Plan: General   Post-op Pain Management:    Induction: Intravenous, Rapid sequence and Cricoid pressure planned  PONV Risk Score and Plan: 2 and Ondansetron  Airway Management Planned: Oral ETT  Additional Equipment: None  Intra-op Plan:   Post-operative Plan: Extubation in OR  Informed Consent: I have reviewed the patients History and Physical, chart, labs and discussed the procedure including the risks, benefits and alternatives for the proposed anesthesia with the patient or authorized representative who has indicated his/her understanding and acceptance.     Dental advisory given  Plan Discussed with: CRNA  Anesthesia Plan Comments:         Anesthesia Quick Evaluation
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
## 2020-09-26 NOTE — Anesthesia Postprocedure Evaluation (Signed)
Anesthesia Post Note  Patient: Sandy Blouch  Procedure(s) Performed: Superficial Femoral Artery Exploration (Right Leg Upper) Superficial Femoral Artery Patch Angioplasty using patch from Right Leg Greater Saphenous Vein, and repair of Intimal Defect (Right Leg Upper)     Patient location during evaluation: PACU Anesthesia Type: General Level of consciousness: awake and alert Pain management: pain level controlled Vital Signs Assessment: post-procedure vital signs reviewed and stable Respiratory status: spontaneous breathing, nonlabored ventilation, respiratory function stable and patient connected to nasal cannula oxygen Cardiovascular status: blood pressure returned to baseline and stable Postop Assessment: no apparent nausea or vomiting Anesthetic complications: no   No complications documented.  Last Vitals:  Vitals:   09/26/20 1540 09/26/20 1555  BP: 130/76 137/87  Pulse: 66 80  Resp: 14 19  Temp:    SpO2: 93% 97%    Last Pain:  Vitals:   09/26/20 1540  TempSrc:   PainSc: Asleep                 Shelton Silvas
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
## 2020-09-26 NOTE — Transfer of Care (Signed)
Immediate Anesthesia Transfer of Care Note  Patient: Robert Potter  Procedure(s) Performed: Superficial Femoral Artery Exploration (Right Leg Upper) Superficial Femoral Artery Patch Angioplasty using patch from Right Leg Greater Saphenous Vein, and repair of Intimal Defect (Right Leg Upper)  Patient Location: PACU  Anesthesia Type:General  Level of Consciousness: awake, oriented and patient cooperative  Airway & Oxygen Therapy: Patient Spontanous Breathing and Patient connected to nasal cannula oxygen  Post-op Assessment: Report given to RN and Post -op Vital signs reviewed and stable  Post vital signs: Reviewed  Last Vitals:  Vitals Value Taken Time  BP 144/77 09/26/20 1409  Temp    Pulse 86 09/26/20 1411  Resp 18 09/26/20 1411  SpO2 93 % 09/26/20 1411  Vitals shown include unvalidated device data.  Last Pain:  Vitals:   09/26/20 1016  TempSrc:   PainSc: 10-Worst pain ever      Patients Stated Pain Goal: 2 (09/26/20 1016)  Complications: No complications documented.
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
## 2020-09-27 ENCOUNTER — Encounter (HOSPITAL_COMMUNITY): Payer: Self-pay | Admitting: Vascular Surgery

## 2020-09-27 LAB — APTT: aPTT: 27 seconds (ref 24–36)

## 2020-09-27 LAB — CBC
HCT: 49.9 % (ref 39.0–52.0)
Hemoglobin: 17 g/dL (ref 13.0–17.0)
MCH: 31.6 pg (ref 26.0–34.0)
MCHC: 34.1 g/dL (ref 30.0–36.0)
MCV: 92.8 fL (ref 80.0–100.0)
Platelets: 253 10*3/uL (ref 150–400)
RBC: 5.38 MIL/uL (ref 4.22–5.81)
RDW: 12.3 % (ref 11.5–15.5)
WBC: 9.6 10*3/uL (ref 4.0–10.5)
nRBC: 0 % (ref 0.0–0.2)

## 2020-09-27 LAB — BASIC METABOLIC PANEL
Anion gap: 10 (ref 5–15)
BUN: <5 mg/dL — ABNORMAL LOW (ref 6–20)
CO2: 26 mmol/L (ref 22–32)
Calcium: 8.8 mg/dL — ABNORMAL LOW (ref 8.9–10.3)
Chloride: 100 mmol/L (ref 98–111)
Creatinine, Ser: 0.82 mg/dL (ref 0.61–1.24)
GFR, Estimated: >60 mL/min (ref >60)
Glucose, Bld: 117 mg/dL — ABNORMAL HIGH (ref 70–99)
Potassium: 4 mmol/L (ref 3.5–5.1)
Sodium: 136 mmol/L (ref 135–145)
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
## 2020-09-28 MED ORDER — ASPIRIN 81 MG PO TBEC: 81.0000 mg | DELAYED_RELEASE_TABLET | Freq: Every day | ORAL | Status: AC

## 2020-09-28 MED ORDER — OXYCODONE-ACETAMINOPHEN 5-325 MG PO TABS
1.0000 | ORAL_TABLET | Freq: Four times a day (QID) | ORAL | 0 refills | Status: DC | PRN
Start: 2020-09-28 — End: 2020-10-29

## 2020-09-28 NOTE — Discharge Instructions (Signed)
 [facility] [place] [facility]  Discharge instructions  Lower Extremity Bypass Surgery  Please refer to the following instruction for your post-procedure care. Your surgeon or physician assistant will discuss any changes with you.  Activity  You are encouraged to walk as much as you can. You can slowly return to normal activities during the month after your surgery. Avoid strenuous activity and heavy lifting until your doctor tells you it's OK. Avoid activities such as vacuuming or swinging a golf club. Do not drive until your doctor give the OK and you are no longer taking prescription pain medications. It is also normal to have difficulty with sleep habits, eating and bowel movement after surgery. These will go away with time.  Bathing/Showering  Shower daily after you go home. Do not soak in a bathtub, hot tub, or swim until the incision heals completely.  Incision Care  Clean your incision with mild soap and water. Shower every day. Pat the area dry with a clean towel. You do not need a bandage unless otherwise instructed. Do not apply any ointments or creams to your incision. If you have open wounds you will be instructed how to care for them or a visiting nurse may be arranged for you. If you have staples or sutures along your incision they will be removed at your post-op appointment. You may have skin glue on your incision. Do not peel it off. It will come off on its own in about one week.   Diet  Resume your normal diet. There are no special food restrictions following this procedure. A low fat/ low cholesterol diet is recommended for all patients with vascular disease. In order to heal from your surgery, it is CRITICAL to get adequate nutrition. Your body requires vitamins, minerals, and protein. Vegetables are the best source of vitamins and minerals. Vegetables also provide the perfect balance of protein. Processed food has little nutritional value, so try to avoid  this.  Medications  Resume taking all your medications unless your doctor or physician assistant tells you not to. If your incision is causing pain, you may take over-the-counter pain relievers such as acetaminophen (Tylenol). If you were prescribed a stronger pain medication, please aware these medication can cause nausea and constipation. Prevent nausea by taking the medication with a snack or meal. Avoid constipation by drinking plenty of fluids and eating foods with high amount of fiber, such as fruits, vegetables, and grains. Take Colace 100 mg (an over-the-counter stool softener) twice a day as needed for constipation.  Do not take Tylenol if you are taking prescription pain medications.  Follow Up  Our office will schedule a follow up appointment 2-3 weeks following discharge.  Please call us immediately for any of the following conditions  .Severe or worsening pain in your legs or feet while at rest or while walking .Increase pain, redness, warmth, or drainage (pus) from your incision site(s) Fever of 101 degree or higher The swelling in your leg with the bypass suddenly worsens and becomes more painful than when you were in the hospital If you have been instructed to feel your graft pulse then you should do so every day. If you can no longer feel this pulse, call the office immediately. Not all patients are given this instruction.  Leg swelling is common after leg bypass surgery.  The swelling should improve over a few months following surgery. To improve the swelling, you may elevate your legs above the level of your heart while   you are sitting or resting. Your surgeon or physician assistant may ask you to apply an ACE wrap or wear compression (TED) stockings to help to reduce swelling.  Reduce your risk of vascular disease  Stop smoking. If you would like help call [facility] at 1-800-QUIT-NOW (1-800-784-8669) or [facility] at 336-586-4000.  Manage your cholesterol Maintain a  desired weight Control your diabetes weight Control your diabetes Keep your blood pressure down  If you have any questions, please call the office at [phone]   
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
## 2020-09-28 NOTE — Progress Notes (Signed)
   09/28/20 0900  OTHER  Substance Abuse Education Offered Yes  Substance abuse interventions Other (must comment);Patient Counseling (Patient reports he is curretly involved with life changes for substance use classes. Patient reports current drinking few times a week and on occasion drinking four or more drinks in the same night.)  (CAGE-AID) Substance Abuse Screening Tool  Have You Ever Felt You Ought to Cut Down on Your Drinking or Drug Use? 1  Have People Annoyed You By Critizing Your Drinking Or Drug Use? 1  Have You Felt Bad Or Guilty About Your Drinking Or Drug Use? 0  Have You Ever Had a Drink or Used Drugs First Thing In The Morning to Steady Your Nerves or to Get Rid of a Hangover? 0  CAGE-AID Score 2

## 2020-09-28 NOTE — Progress Notes (Signed)
JP drain removed per physician orders, vitals signs taken and documented. Patient tolerated well. Entry point covered with gauze and tape. Will continue to monitor.  -Elissa Lovett, RN

## 2020-09-29 ENCOUNTER — Encounter (HOSPITAL_COMMUNITY): Payer: Self-pay

## 2020-10-29 ENCOUNTER — Other Ambulatory Visit: Payer: Self-pay

## 2020-10-29 ENCOUNTER — Encounter: Payer: Self-pay | Admitting: Vascular Surgery

## 2020-10-29 ENCOUNTER — Ambulatory Visit (INDEPENDENT_AMBULATORY_CARE_PROVIDER_SITE_OTHER): Payer: Self-pay | Admitting: Vascular Surgery

## 2020-10-29 VITALS — BP 128/86 | HR 95 | Temp 98.2°F | Resp 20 | Ht 68.0 in | Wt 180.0 lb

## 2020-10-29 DIAGNOSIS — W3400XA Accidental discharge from unspecified firearms or gun, initial encounter: Secondary | ICD-10-CM

## 2020-10-29 DIAGNOSIS — S75091A Other specified injury of femoral artery, right leg, initial encounter: Secondary | ICD-10-CM
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
## 2020-10-30 ENCOUNTER — Other Ambulatory Visit: Payer: Self-pay

## 2020-10-30 DIAGNOSIS — W3400XA Accidental discharge from unspecified firearms or gun, initial encounter: Secondary | ICD-10-CM

## 2021-05-06 ENCOUNTER — Ambulatory Visit: Payer: Self-pay | Admitting: Vascular Surgery

## 2021-05-06 ENCOUNTER — Ambulatory Visit (HOSPITAL_COMMUNITY): Payer: Self-pay

## 2021-05-06 ENCOUNTER — Ambulatory Visit (HOSPITAL_COMMUNITY): Payer: Self-pay | Attending: Vascular Surgery

## 2021-05-12 ENCOUNTER — Other Ambulatory Visit: Payer: Self-pay

## 2021-05-12 ENCOUNTER — Emergency Department
Admission: EM | Admit: 2021-05-12 | Discharge: 2021-05-12 | Disposition: A | Discharge status: Home / Self Care | Payer: Self-pay | Attending: Emergency Medicine | Admitting: Emergency Medicine

## 2021-05-12 DIAGNOSIS — F17210 Nicotine dependence, cigarettes, uncomplicated: Secondary | ICD-10-CM | POA: Insufficient documentation

## 2021-05-12 DIAGNOSIS — Z7982 Long term (current) use of aspirin: Secondary | ICD-10-CM | POA: Insufficient documentation

## 2021-05-12 DIAGNOSIS — R21 Rash and other nonspecific skin eruption: Secondary | ICD-10-CM | POA: Insufficient documentation

## 2021-05-12 MED ORDER — PREDNISONE 10 MG (21) PO TBPK: ORAL_TABLET | ORAL | 0 refills | Status: AC

## 2021-05-12 MED ORDER — DOXYCYCLINE HYCLATE 100 MG PO CAPS: 100.0000 mg | ORAL_CAPSULE | Freq: Two times a day (BID) | ORAL | 0 refills | Status: AC

## 2021-05-12 MED ORDER — DIPHENHYDRAMINE HCL 25 MG PO TABS: 25.0000 mg | ORAL_TABLET | Freq: Four times a day (QID) | ORAL | 0 refills | Status: AC

## 2021-05-12 NOTE — ED Notes (Signed)
MD in room

## 2021-05-12 NOTE — ED Triage Notes (Signed)
Pt c/o rash all over for the past week with SOB. Denies any known allergies. Pt is in NAD at present.
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
## 2023-03-02 ENCOUNTER — Ambulatory Visit
Admission: EM | Admit: 2023-03-02 | Discharge: 2023-03-02 | Disposition: A | Discharge status: Home / Self Care | Payer: Self-pay | Attending: Emergency Medicine | Admitting: Emergency Medicine

## 2023-03-02 DIAGNOSIS — J069 Acute upper respiratory infection, unspecified: Secondary | ICD-10-CM

## 2023-03-02 DIAGNOSIS — H1033 Unspecified acute conjunctivitis, bilateral: Secondary | ICD-10-CM

## 2023-03-02 MED ORDER — POLYMYXIN B-TRIMETHOPRIM 10000-0.1 UNIT/ML-% OP SOLN: 1.0000 [drp] | Freq: Four times a day (QID) | OPHTHALMIC | 0 refills | Status: AC

## 2023-03-02 NOTE — Discharge Instructions (Addendum)
Use the antibiotic eyedrops as prescribed.  Follow up with your primary care provider if your symptoms are not improving.   ? ? ?

## 2023-03-02 NOTE — ED Triage Notes (Signed)
Patient to Urgent Care with complaints of bilateral eye drainage that started three days ago. Right sided eye swelling into upper and lower eyelids. Waking up with eyes crusted shut.  Has also had URI symptoms/ bilateral ear fullness.
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
## 2023-03-03 ENCOUNTER — Other Ambulatory Visit: Payer: Self-pay

## 2023-03-03 ENCOUNTER — Emergency Department: Payer: Self-pay

## 2023-03-03 ENCOUNTER — Encounter: Payer: Self-pay | Admitting: Intensive Care

## 2023-03-03 ENCOUNTER — Emergency Department
Admission: EM | Admit: 2023-03-03 | Discharge: 2023-03-03 | Disposition: A | Discharge status: Home / Self Care | Payer: Self-pay | Attending: Emergency Medicine | Admitting: Emergency Medicine

## 2023-03-03 DIAGNOSIS — Z1152 Encounter for screening for COVID-19: Secondary | ICD-10-CM | POA: Insufficient documentation

## 2023-03-03 DIAGNOSIS — J029 Acute pharyngitis, unspecified: Secondary | ICD-10-CM | POA: Insufficient documentation

## 2023-03-03 DIAGNOSIS — L03213 Periorbital cellulitis: Secondary | ICD-10-CM | POA: Insufficient documentation

## 2023-03-03 DIAGNOSIS — D72829 Elevated white blood cell count, unspecified: Secondary | ICD-10-CM | POA: Insufficient documentation

## 2023-03-03 LAB — RESP PANEL BY RT-PCR (RSV, FLU A&B, COVID)  RVPGX2
Influenza A by PCR: NEGATIVE
Influenza B by PCR: NEGATIVE
Resp Syncytial Virus by PCR: NEGATIVE
SARS Coronavirus 2 by RT PCR: NEGATIVE

## 2023-03-03 LAB — CBC WITH DIFFERENTIAL/PLATELET
Abs Immature Granulocytes: 0.02 10*3/uL (ref 0.00–0.07)
Basophils Absolute: 0.1 10*3/uL (ref 0.0–0.1)
Basophils Relative: 1 %
Eosinophils Absolute: 0.1 10*3/uL (ref 0.0–0.5)
Eosinophils Relative: 1 %
HCT: 50.4 % (ref 39.0–52.0)
Hemoglobin: 18 g/dL — ABNORMAL HIGH (ref 13.0–17.0)
Immature Granulocytes: 0 %
Lymphocytes Relative: 21 %
Lymphs Abs: 2.3 10*3/uL (ref 0.7–4.0)
MCH: 33 pg (ref 26.0–34.0)
MCHC: 35.7 g/dL (ref 30.0–36.0)
MCV: 92.5 fL (ref 80.0–100.0)
Monocytes Absolute: 0.7 10*3/uL (ref 0.1–1.0)
Monocytes Relative: 7 %
Neutro Abs: 7.8 10*3/uL — ABNORMAL HIGH (ref 1.7–7.7)
Neutrophils Relative %: 70 %
Platelets: 299 10*3/uL (ref 150–400)
RBC: 5.45 MIL/uL (ref 4.22–5.81)
RDW: 11.4 % — ABNORMAL LOW (ref 11.5–15.5)
WBC: 10.9 10*3/uL — ABNORMAL HIGH (ref 4.0–10.5)
nRBC: 0 % (ref 0.0–0.2)

## 2023-03-03 LAB — BASIC METABOLIC PANEL
Anion gap: 10 (ref 5–15)
BUN: 13 mg/dL (ref 6–20)
CO2: 23 mmol/L (ref 22–32)
Calcium: 9.2 mg/dL (ref 8.9–10.3)
Chloride: 104 mmol/L (ref 98–111)
Creatinine, Ser: 0.9 mg/dL (ref 0.61–1.24)
GFR, Estimated: >60 mL/min (ref >60)
Glucose, Bld: 94 mg/dL (ref 70–99)
Potassium: 4.1 mmol/L (ref 3.5–5.1)
Sodium: 137 mmol/L (ref 135–145)

## 2023-03-03 LAB — GROUP A STREP BY PCR: Group A Strep by PCR: NOT DETECTED

## 2023-03-03 MED ORDER — FLUORESCEIN SODIUM 1 MG OP STRP
1.0000 | ORAL_STRIP | Freq: Once | OPHTHALMIC | Status: AC
  Administered 2023-03-03: 1 via OPHTHALMIC
  Filled 2023-03-03: qty 1

## 2023-03-03 MED ORDER — KETOROLAC TROMETHAMINE 15 MG/ML IJ SOLN
15.0000 mg | Freq: Once | INTRAMUSCULAR | Status: AC
  Administered 2023-03-03: 15 mg via INTRAVENOUS
  Filled 2023-03-03: qty 1

## 2023-03-03 MED ORDER — AMOXICILLIN-POT CLAVULANATE 875-125 MG PO TABS: 1.0000 | ORAL_TABLET | Freq: Two times a day (BID) | ORAL | 0 refills | Status: AC

## 2023-03-03 MED ORDER — DEXAMETHASONE SODIUM PHOSPHATE 10 MG/ML IJ SOLN
10.0000 mg | Freq: Once | INTRAMUSCULAR | Status: AC
  Administered 2023-03-03: 10 mg via INTRAVENOUS
  Filled 2023-03-03: qty 1

## 2023-03-03 MED ORDER — PROPARACAINE HCL 0.5 % OP SOLN
1.0000 [drp] | Freq: Once | OPHTHALMIC | Status: AC
  Administered 2023-03-03: 1 [drp] via OPHTHALMIC
  Filled 2023-03-03: qty 15

## 2023-03-03 MED ORDER — SODIUM CHLORIDE 0.9 % IV BOLUS
1000.0000 mL | Freq: Once | INTRAVENOUS | Status: AC
  Administered 2023-03-03: 1000 mL via INTRAVENOUS

## 2023-03-03 MED ORDER — IOHEXOL 300 MG/ML  SOLN
75.0000 mL | Freq: Once | INTRAMUSCULAR | Status: AC | PRN
  Administered 2023-03-03: 75 mL via INTRAVENOUS

## 2023-03-03 MED ORDER — ERYTHROMYCIN 5 MG/GM OP OINT: 1.0000 | TOPICAL_OINTMENT | Freq: Every day | OPHTHALMIC | 0 refills | Status: AC
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
## 2023-03-03 NOTE — Discharge Instructions (Addendum)
Please take the antibiotics as prescribed.  Please follow-up with the eye doctor and the ENT doctor for recheck of your symptoms this week.  Please return for any new, worsening, or change in symptoms or other concerns.  It was a pleasure caring for you today.
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
## 2023-09-04 ENCOUNTER — Ambulatory Visit
Admission: EM | Admit: 2023-09-04 | Discharge: 2023-09-04 | Disposition: A | Discharge status: Home / Self Care | Payer: Self-pay

## 2023-09-04 ENCOUNTER — Encounter: Payer: Self-pay | Admitting: *Deleted

## 2023-09-04 DIAGNOSIS — K0889 Other specified disorders of teeth and supporting structures: Secondary | ICD-10-CM

## 2023-09-04 MED ORDER — AMOXICILLIN-POT CLAVULANATE 875-125 MG PO TABS: 1.0000 | ORAL_TABLET | Freq: Two times a day (BID) | ORAL | 0 refills | Status: AC

## 2023-09-04 MED ORDER — LIDOCAINE VISCOUS HCL 2 % MT SOLN: 15.0000 mL | OROMUCOSAL | 0 refills | Status: AC | PRN

## 2023-09-04 MED ORDER — ACETAMINOPHEN-CODEINE 300-30 MG PO TABS: 1.0000 | ORAL_TABLET | Freq: Three times a day (TID) | ORAL | 0 refills | Status: AC | PRN
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
## 2023-09-04 NOTE — ED Triage Notes (Signed)
Patient states left sided back dental pain since Friday, hasn't seen a dentist.  Taking advil OTC w/ no relief.

## 2023-09-04 NOTE — Discharge Instructions (Addendum)
Today you are evaluated for your dental pain  Begin Augmentin every morning and every evening for 7 days to clear any Derm contributing to your symptoms  You may use ibuprofen every 8 hours as needed to help manage dental pain, if ineffective can use Tylenol 3 which has codeine in it, please be mindful this make you drowsy, may use every 8 hours, only take for severe pain  Spit lidocaine solution every 4-6 hours to provide temporary numbing effect to your mouth  You may attempt use of salt water gargles, throat lozenges warm liquids and soft foods for additional supportive care  Please schedule follow-up appointment with dentist as they are the only person who can truly manage your dental pain, information to a local dentist only from page
